# Patient Record
Sex: Male | Born: 1955
Health system: Midwestern US, Community
[De-identification: ages and names within clinical notes are randomized; demographics above are authoritative.]

## PROBLEM LIST (undated history)

## (undated) DIAGNOSIS — D049 Carcinoma in situ of skin, unspecified: Secondary | ICD-10-CM

---

## 2012-12-01 NOTE — Progress Notes (Signed)
[facility]  Kirstie Peri, M.D.  [phone]       Winter Trefz  [birth year]    57 y.o. male     Date of Visit: 12/01/2012    Chief Complaint:   Chief Complaint   Patient presents with   ??? Follow-up     est. pt comes in for a skin check.         I was asked to see this patient by Dr. William Hamburger.    History of Present Illness:  1.  Patient presents today for total body skin exam.  He has a long history of flat warts on his forearms.  He states that they are improving and much smaller and fewer in number than they were prior to beginning liquid nitrogen treatment.  He does not like to come in more often than every 6 months for his regular skin checks.    He has noticed some new rough scaly papules especially on his temples and sideburn areas.  They are scaly occasionally become crusted.  Not itching bleeding changing or growing        7/11 squamous cell carcinoma in situ left lower parasternal chest status post ED and C  10/13 dysplastic nevus, mild left dorsal foot    Review of Systems:  Constitutional: Reports general sense of well-being   Cardiac:  No active chest pain  Pulmonary:  No SOB  GI:  No abdominal pain     Past Medical History, Surgical History, Family History, Medications and Allergies reviewed.    Social History:   History     Social History   ??? Marital Status: Married     Spouse Name: N/A     Number of Children: N/A   ??? Years of Education: N/A     Occupational History   ??? Not on file.     Social History Main Topics   ??? Smoking status: Former Smoker   ??? Smokeless tobacco: Never Used   ??? Alcohol Use: Yes   ??? Drug Use: Not on file   ??? Sexual Activity: Not on file     Other Topics Concern   ??? Not on file     Social History Narrative       Physical Examination       The following were examined and determined to be normal: Psych/Neuro, Scalp/hair, Head/face, Conjunctivae/eyelids, Neck, Breast/axilla/chest, Abdomen, Back, RUE, LUE, RLE, LLE and Nails/digits.    The following were examined and  determined to be abnormal: Scalp/hair, Head/face, Breast/axilla/chest, Back, RUE and LUE.     -General: Well-appearing, NAD  1.  History of dysplastic nevus no evidence of recurrence.  History of squamous cell carcinoma no evidence of recurrence.  Multiple gritty erythematous papules in his sideburns and forehead/ scalp.  Multiple flat topped pink papules over his forearms and 2 on his right anterior shin, flat warts.  Multiple hyperkeratotic stuck on papules and plaques, seborrheic keratoses.    Assessment and Plan     1. Verruca -12 lesion(s) treated w/ liquid nitrogen x 2 cycles. Edu re: risk of blister formation, discomfort, scar, hypopigmentation. Discussed wound care.      2. AK (actinic keratosis) - -4 lesion(s) treated w/ liquid nitrogen x 2 cycles. Edu re: risk of blister formation, discomfort, scar, hypopigmentation. Discussed wound care.      3. Keratosis, seborrheic - Monitor for change   4. History of squamous cell carcinoma - NER, monitor     5. History of Dysplastic nevus-  NER

## 2013-05-31 NOTE — Progress Notes (Signed)
[facility]  [name], M.D.  [phone]       Raymond Griffin  08-06-1955    58 y.o. male     Date of Visit: 05/31/2013    Chief Complaint:   Chief Complaint   Patient presents with   ??? Follow-up     skin check         I was asked to see this patient by Dr. No ref. provider found.    History of Present Illness:  1.   Patient presents today for total body skin exam.     He has a long history of flat warts on his forearms. He states that they are improving and much smaller and fewer in number than they were prior to beginning liquid nitrogen treatment. He does not like to come in more often than every 6 months for his regular skin checks.  He has noticed multiple new lesions on his lower legs.    He has noticed multiple raised scaly brown papules and plaques.  They're not itching or bleeding.  He doesn't think that they're changing in shape or size.    7/11 squamous cell carcinoma in situ left lower parasternal chest status post ED and C  10/13 dysplastic nevus, mild left dorsal foot    Review of Systems:  Constitutional: Reports general sense of well-being   Cardiac:  No active chest pain  Pulmonary:  No SOB  GI:  No abdominal pain     Past Medical History, Surgical History, Family History, Medications and Allergies reviewed.    Social History:   History     Social History   ??? Marital Status: Married     Spouse Name: N/A     Number of Children: N/A   ??? Years of Education: N/A     Occupational History   ??? Not on file.     Social History Main Topics   ??? Smoking status: Former Smoker   ??? Smokeless tobacco: Never Used   ??? Alcohol Use: Yes   ??? Drug Use: Not on file   ??? Sexual Activity: Not on file     Other Topics Concern   ??? Not on file     Social History Narrative       Physical Examination       -General: Well-appearing, NAD  1. Normal affect.  Total body skin exam including scalp, face, neck, chest, abdomen, back, bilateral upper extremities, bilateral lower extremities, ocular conjunctiva, external  lips, and nails was performed.  Underwear area not examined.    Left ant shoulder 9 mm irregularly pigmented papule roatypia  Left mid PS back 1.3 cm irregularly pigmented plaque ro atypia  Left malar cheek- 8 mm gritty pink papule  Multiple flat topped pink papules- flat warts- arms and legs   Well healed scar at site of previous SCC   Well healed scar at site of dysplastic nevus, NER.              Assessment and Plan     1. Neoplasm of uncertain behavior of skin - Left ant shoulder 9 mm irregularly pigmented papule roatypia  Left mid PS back 1.3 cm irregularly pigmented plaque ro atypia  -Discussed possible diagnosis. Patient agreeable to biopsy. Verbal consent obtained after risks (infection, bleeding, scar), benefits and alternatives explained.   -Area(s) to be biopsied were marked with a surgical pen. Site(s) were cleansed with alcohol. Local anesthesia achieved with 1% lidocaine with epinephrine/sodium bicarbonate. Shave biopsy performed  with a razor blade. Hemostasis was achieved with aluminum chloride. The wound(s) were dressed with petrolatum and covered with a bandage. Specimen(s) sent to pathology. Pt educated re: risk of bleeding, infection, scar and wound care instructions.     2. AK (actinic keratosis) - -1 lesion(s) treated w/ liquid nitrogen x 2 cycles. Edu re: risk of blister formation, discomfort, scar, hypopigmentation. Discussed wound care.      3. Verruca - -11 lesion(s) treated w/ liquid nitrogen x 2 cycles. Edu re: risk of blister formation, discomfort, scar, hypopigmentation. Discussed wound care.      4. History of dysplastic nevus -no evidence of recurrence    5. History of squamous cell carcinoma - No evidence of recurrence. Discussed risk of subsequent skin cancers and increased risk of melanoma. Reviewed importance of monitoring skin for change and sun protection with hats and sunscreen, as well as sun avoidance.

## 2013-06-07 NOTE — Telephone Encounter (Signed)
Spoke with Patient in regards to Bx results of:    Location: 1. Left ant shoulder 2. Left mid PS back     Diagnosis: 1. Squamous acanthoma 2. Squamous acanthoma    Plan: Both Benign     Patient was advised to Return to clinic as scheduled.

## 2013-06-07 NOTE — Telephone Encounter (Signed)
Error

## 2013-06-07 NOTE — Telephone Encounter (Signed)
Spoke with Patient in regards to Bx results of:    Location: 1. Left mid PS back 2. Left anterior shoulder    Diagnosis: 1.    Plan:    Patient was advised to Return to clinic as scheduled.

## 2013-06-13 NOTE — Telephone Encounter (Signed)
Diagnosis: Squamous acanthoma for both sites  Benign

## 2013-12-06 ENCOUNTER — Encounter: Attending: Dermatology

## 2013-12-06 ENCOUNTER — Ambulatory Visit: Admit: 2013-12-06 | Discharge: 2013-12-06 | Payer: PRIVATE HEALTH INSURANCE | Attending: Dermatology

## 2013-12-06 DIAGNOSIS — L570 Actinic keratosis: Secondary | ICD-10-CM

## 2013-12-06 NOTE — Progress Notes (Signed)
Raymond Griffin  Raymond Griffin, M.D.  [phone]       Raymond Griffin  1955-06-29    58 y.o. male     Date of Visit: 12/06/2013    Chief Complaint:   Chief Complaint   Patient presents with   ??? Follow-up     6 month skin check         I was asked to see this patient by Dr. No ref. provider found.    History of Present Illness:  1.  Total body skin exam    Flat warts are improved.  Previously had quite a bit of difficulty with the spreading.  Has noticed good improvement since been treating them.    A few new scaly spots on his lateral face and ears.  Nothing else new or changing.  Wearing hats and sunscreen regularly.      Skin history:  7/11 squamous cell carcinoma in situ left lower parasternal chest status post ED and C  10/13 dysplastic nevus, mild left dorsal foot    Review of Systems:  Constitutional: Reports general sense of well-being   Cardiac:  No active chest pain  Pulmonary:  No SOB  GI:  No abdominal pain     Past Medical History, Surgical History, Family History, Medications and Allergies reviewed.    Social History:   History     Social History   ??? Marital Status: Married     Spouse Name: N/A     Number of Children: N/A   ??? Years of Education: N/A     Occupational History   ??? Not on file.     Social History Main Topics   ??? Smoking status: Former Smoker   ??? Smokeless tobacco: Never Used   ??? Alcohol Use: Yes   ??? Drug Use: Not on file   ??? Sexual Activity: Not on file     Other Topics Concern   ??? Not on file     Social History Narrative       Physical Examination       -General: Well-appearing, NAD  Normal affect.  Total body skin exam including scalp, face, neck, chest, abdomen, back, bilateral upper extremities, bilateral lower extremities, ocular conjunctiva, external lips, and nails was performed.  Underwear area not examined.  Multiple gritty erythematous papules over his forehead, lateral face, ears  Multiple flat topped pink papules on his forearms  Scattered on the trunk and extremities  are multiple well-defined round and oval symmetric smoothly-bordered uniformly brown macules and papules.  Well-healed scars at the site of his prior dysplastic nevi and squamous cell carcinomas   Assessment and Plan     1. AK (actinic keratosis) - -6 lesion(s) treated w/ liquid nitrogen x 2 cycles. Edu re: risk of blister formation, discomfort, scar, hypopigmentation. Discussed wound care.      2. Flat wart - -11 lesion(s) treated w/ liquid nitrogen x 2 cycles. Edu re: risk of blister formation, discomfort, scar, hypopigmentation. Discussed wound care.   Discussed treatment to prevent spread, given the viral nature of the condition.     3. Benign nevus - Benign acquired melanocytic nevi  -Recommend monthly self skin exams   -Educated regarding the ABCDEs of melanoma detection   -Call for any new/changing moles or concerning lesions  -Reviewed sun protective behavior -- sun avoidance during the peak hours of the day, sun-protective clothing (including hat and sunglasses), sunscreen use (water resistant, broad spectrum, SPF at least 30, need for reapplication  every 2 to 3 hours), avoidance of tanning beds      4. History of squamous cell carcinoma - No evidence of recurrence. Discussed risk of subsequent skin cancers and increased risk of melanoma. Reviewed importance of monitoring skin for change and sun protection with hats and sunscreen, as well as sun avoidance.     5. History of dysplastic nevus - no evidence of recurrence      RV 6 months

## 2014-06-06 ENCOUNTER — Ambulatory Visit: Admit: 2014-06-06 | Discharge: 2014-06-06 | Payer: PRIVATE HEALTH INSURANCE | Attending: Dermatology

## 2014-06-06 DIAGNOSIS — B078 Other viral warts: Secondary | ICD-10-CM

## 2014-06-06 NOTE — Progress Notes (Signed)
[facility]  [name], M.D.  [phone]       Raymond Griffin  September 25, 1955    59 y.o. male     Date of Visit: 06/06/2014    Chief Complaint:   Chief Complaint   Patient presents with   ??? Follow-up     6 month skin check        I was asked to see this patient by Dr. No ref. provider found.    History of Present Illness:  1.  Total body skin exam.    Patient has not noticed any new or changing lesions.  He is wearing hats and sunscreen.    Verruca on his left forearm continue to improve.  Still has a few small residual lesions.    Skin history:  7/11 squamous cell carcinoma in situ left lower parasternal chest status post ED and C  10/13 dysplastic nevus, mild left dorsal foot  Flat warts-forearms    Review of Systems:  Constitutional: Reports general sense of well-being   Cardiac:  No active chest pain  Pulmonary:  No SOB  GI:  No abdominal pain     Past Medical History, Surgical History, Family History, Medications and Allergies reviewed.    Social History:   History     Social History   ??? Marital Status: Married     Spouse Name: N/A     Number of Children: N/A   ??? Years of Education: N/A     Occupational History   ??? Not on file.     Social History Main Topics   ??? Smoking status: Former Smoker   ??? Smokeless tobacco: Never Used   ??? Alcohol Use: Yes   ??? Drug Use: Not on file   ??? Sexual Activity: Not on file     Other Topics Concern   ??? Not on file     Social History Narrative       Physical Examination       -General: Well-appearing, NAD  Normal affect.  Total body skin exam including scalp, face, neck, chest, abdomen, back, bilateral upper extremities, bilateral lower extremities, ocular conjunctiva, external lips, and nails was performed.  Underwear area not examined.  Flat top verrucous papule left anterior forearm, much smaller than previously  Gritty erythematous papules face and dorsal hands as well as dorsal forearms, actinic keratoses  Scattered on the trunk and extremities are multiple  well-defined round and oval symmetric smoothly-bordered uniformly brown macules and papules.  Multiple hyperkeratotic stuck on papules and plaques, seborrheic keratoses  Well-healed scar at the site of his prior dysplastic nevus and squamous cell carcinoma, no sign of recurrence      Assessment and Plan     1. Flat wart - -2 lesion(s) treated w/ liquid nitrogen x 2 cycles. Edu re: risk of blister formation, discomfort, scar, hypopigmentation. Discussed wound care.      2. AK (actinic keratosis) - -4 lesion(s) treated w/ liquid nitrogen x 2 cycles. Edu re: risk of blister formation, discomfort, scar, hypopigmentation. Discussed wound care.      3. Benign nevus - Benign acquired melanocytic nevi  -Recommend monthly self skin exams   -Educated regarding the ABCDEs of melanoma detection   -Call for any new/changing moles or concerning lesions  -Reviewed sun protective behavior -- sun avoidance during the peak hours of the day, sun-protective clothing (including hat and sunglasses), sunscreen use (water resistant, broad spectrum, SPF at least 30, need for reapplication every 2 to 3  hours), avoidance of tanning beds      4. Keratosis, seborrheic - monitor for change    5. History of squamous cell carcinoma - No evidence of recurrence. Discussed risk of subsequent skin cancers and increased risk of melanoma. Reviewed importance of monitoring skin for change and sun protection with hats and sunscreen, as well as sun avoidance.     6. History of dysplastic nevus -no evidence of recurrence

## 2014-12-12 MED ORDER — TRIAMCINOLONE ACETONIDE 0.1 % EX OINT
0.1 % | CUTANEOUS | 0 refills | Status: DC
Start: 2014-12-12 — End: 2016-09-23

## 2014-12-12 NOTE — Progress Notes (Signed)
[facility]  [name], M.D.  [phone]       Raymond Griffin  1955/08/04    59 y.o. male     Date of Visit: 12/12/2014    Chief Complaint:   Chief Complaint   Patient presents with   ??? Follow-up     TBSE        I was asked to see this patient by Dr. No ref. provider found.    History of Present Illness:  Patient presents today for total body skin exam.    He has developed a rash on his left lower leg.  He has been wearing a brace on that leg and notes that when he became hot and sweaty this summer he developed some erythema and scale.  Pruritic.  Tends to come and go and is worse after he has worn his brace.    No new or changing lesions.  Wearing hats and sunscreen regularly.    Skin history:  7/11 squamous cell carcinoma in situ left lower parasternal chest status post ED and C  10/13 dysplastic nevus, mild left dorsal foot  Flat warts-forearms  ??  Review of Systems:  Constitutional: Reports general sense of well-being   Cardiac:  No active chest pain  Pulmonary:  No SOB  GI:  No abdominal pain     Past Medical History, Surgical History, Family History, Medications and Allergies reviewed.    Social History:   Social History     Social History   ??? Marital status: Married     Spouse name: N/A   ??? Number of children: N/A   ??? Years of education: N/A     Occupational History   ??? Not on file.     Social History Main Topics   ??? Smoking status: Former Smoker   ??? Smokeless tobacco: Never Used   ??? Alcohol use Yes   ??? Drug use: Not on file   ??? Sexual activity: Not on file     Other Topics Concern   ??? Not on file     Social History Narrative       Physical Examination       -General: Well-appearing, NAD  Normal affect.  Total body skin exam including scalp, face, neck, chest, abdomen, back, bilateral upper extremities, bilateral lower extremities, ocular conjunctiva, external lips, and nails was performed.  Underwear area not examined.  Scattered on the trunk and extremities are multiple well-defined round  and oval symmetric smoothly-bordered uniformly brown macules and papules.  Multiple hyperkeratotic stuck on papules and plaques over his torso, seborrheic keratoses.  Well-healed scars at sites of prior skin cancers no sign of recurrence.  Multiple gritty erythematous papules over his scalp and temples, actinic keratoses.  Scattered erythematous scaly plaques on his left lower leg, irritant dermatitis      Assessment and Plan     1. AK (actinic keratosis) - scalp and temples-4 lesion(s) treated w/ liquid nitrogen. Edu re: risk of blister formation, discomfort, scar, hypopigmentation. Discussed wound care.      2. Benign nevus - Benign acquired melanocytic nevi  -Recommend monthly self skin exams   -Educated regarding the ABCDEs of melanoma detection   -Call for any new/changing moles or concerning lesions  -Reviewed sun protective behavior -- sun avoidance during the peak hours of the day, sun-protective clothing (including hat and sunglasses), sunscreen use (water resistant, broad spectrum, SPF at least 30, need for reapplication every 2 to 3 hours), avoidance of tanning beds  3. Keratosis, seborrheic -monitor for change    4. History of squamous cell carcinoma - No evidence of recurrence. Discussed risk of subsequent skin cancers and increased risk of melanoma. Reviewed importance of monitoring skin for change and sun protection with hats and sunscreen, as well as sun avoidance.     5. History of dysplastic nevus -no evidence of recurrence    6. Irritant dermatitis -triamcinolone 0.1% ointment b.i.d. for 2 weeks.  Reviewed proper use and side effects.

## 2015-06-12 ENCOUNTER — Encounter: Attending: Dermatology

## 2015-09-11 ENCOUNTER — Ambulatory Visit: Admit: 2015-09-11 | Discharge: 2015-09-11 | Payer: BLUE CROSS/BLUE SHIELD | Attending: Dermatology

## 2015-09-11 DIAGNOSIS — D485 Neoplasm of uncertain behavior of skin: Secondary | ICD-10-CM

## 2015-09-11 NOTE — Progress Notes (Signed)
[facility]  [name], M.D.  [phone]       Raymond Griffin  [birth year]    60 y.o. male     Date of Visit: 09/11/2015    Chief Complaint:   Chief Complaint   Patient presents with   ??? Skin Exam     total body        I was asked to see this patient by Dr. No ref. provider found.    History of Present Illness:  1. Total body skin exam    Patient has developed a new keratotic plaque right mid scalp.  Developed suddenly in May 2017 and is slowly increasing in size.  Asymptomatic-not itching, bleeding.    Multiple nevi.  Stable in size, shape, color.  Asymptomatic    Multiple seborrheic keratoses dorsal forearms and back.  Slowly increasing in size and number.  Asymptomatic.    Wearing hats and sunscreen.  Planning to retire in December    Skin history:  7/11 squamous cell carcinoma in situ left lower parasternal chest status post ED and C  10/13 dysplastic nevus, mild left dorsal foot  Actinic keratosis-liquid nitrogen  Flat warts-forearms    Review of Systems:  Constitutional: Reports general sense of well-being   Cardiac:  No active chest pain  Pulmonary:  No SOB  GI:  No abdominal pain     Past Medical History, Surgical History, Family History, Medications and Allergies reviewed.    Social History:   Social History     Social History   ??? Marital status: Married     Spouse name: N/A   ??? Number of children: N/A   ??? Years of education: N/A     Occupational History   ??? Not on file.     Social History Main Topics   ??? Smoking status: Former Smoker   ??? Smokeless tobacco: Never Used   ??? Alcohol use Yes   ??? Drug use: Not on file   ??? Sexual activity: Not on file     Other Topics Concern   ??? Not on file     Social History Narrative       Physical Examination       -General: Well-appearing, NAD  Normal affect.  Total body skin exam including scalp, face, neck, chest, abdomen, back, bilateral upper extremities, bilateral lower extremities, ocular conjunctiva, external lips, and nails was performed.   Underwear area not examined.  Scattered on the trunk and extremities are multiple well-defined round and oval symmetric smoothly-bordered uniformly brown macules and papules.well-healed scar at site of prior squamous cell carcinoma and dysplastic nevus  Multiple hyperkeratotic stuck on papules and plaques dorsal forearms and back-seborrheic keratoses.  Right mid scalp 1.1 cm keratotic plaque rule out squamous cell carcinoma          Assessment and Plan     1. Neoplasm of uncertain behavior of skin -right mid scalp--Discussed possible diagnosis. Patient agreeable to biopsy. Verbal consent obtained after risks (infection, bleeding, scar), benefits and alternatives explained.   -Area(s) to be biopsied were marked with a surgical pen. Site(s) were cleansed with alcohol. Local anesthesia achieved with 1% lidocaine with epinephrine/sodium bicarbonate. Shave biopsy performed with a razor blade. Hemostasis was achieved with aluminum chloride. The wound(s) were dressed with petrolatum and covered with a bandage. Specimen(s) sent to pathology. Pt educated re: risk of bleeding, infection, scar and wound care instructions.     2. Benign nevus - Benign acquired melanocytic nevi  -Recommend  monthly self skin exams   -Educated regarding the ABCDEs of melanoma detection   -Call for any new/changing moles or concerning lesions  -Reviewed sun protective behavior -- sun avoidance during the peak hours of the day, sun-protective clothing (including hat and sunglasses), sunscreen use (water resistant, broad spectrum, SPF at least 30, need for reapplication every 2 to 3 hours), avoidance of tanning beds      3. Seborrheic keratoses -Monitor for change   4. History of dysplastic nevus -no evidence of recurrence   5. History of nonmelanoma skin cancer - No evidence of recurrence. Discussed risk of subsequent skin cancers and increased risk of melanoma. Reviewed importance of monitoring skin for change and sun protection with hats and  sunscreen, as well as sun avoidance.

## 2015-09-18 ENCOUNTER — Telehealth

## 2015-09-18 NOTE — Telephone Encounter (Signed)
Error

## 2015-09-18 NOTE — Progress Notes (Signed)
Addendum right mid scalp Bowen's disease transected at the margin.  Refer to Mohs given size of lesion.

## 2015-09-18 NOTE — Addendum Note (Signed)
Addended by: Myna Bright on: 09/18/2015 04:06 PM     Modules accepted: Orders

## 2015-09-18 NOTE — Telephone Encounter (Signed)
Reviewed results of the biopsy with the patient.    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 Caryn Section     The patient expressed understanding of the plan.

## 2015-09-19 NOTE — Telephone Encounter (Signed)
Called and spoke to pt.    Explained that the path report showed Bowen's Disease (intraepidermal squamous cell carcinoma).  Pt understood.  Call complete

## 2015-09-19 NOTE — Telephone Encounter (Signed)
Patient called back.  He was in his car and didn't hear all of the results.  Wants to know what type of cancer he has.?    Contact # [phone]

## 2015-10-15 ENCOUNTER — Ambulatory Visit: Admit: 2015-10-15 | Discharge: 2015-10-15 | Payer: BLUE CROSS/BLUE SHIELD | Attending: Dermatology

## 2015-10-15 DIAGNOSIS — D044 Carcinoma in situ of skin of scalp and neck: Secondary | ICD-10-CM

## 2015-10-15 NOTE — Progress Notes (Signed)
MOHS PROCEDURE NOTE    PHYSICIAN:  Mirian Mo. Caryn Section, MD    ASSISTANT: Abigail Miyamoto, MA     REFERRING PROVIDER:  Veneda Melter, MD    PREOPERATIVE DIAGNOSIS: Squamous Cell Carcinoma in Situ     SPECIFIC MOHS INDICATIONS:  location    AUC SCORING:  8/9    POSTOPERATIVE DIAGNOSIS: SAME    LOCATION: Right mid scalp    OPERATIVE PROCEDURE:  MOHS MICROGRAPHIC SURGERY    RECONSTRUCTION OF DEFECT: Complex layered closure    PREOPERATIVE SIZE: 12x8 MM    DEFECT SIZE: 19x12 MM    LENGTH OF REPAIRED WOUND/SIZE OF FLAP/SIZE OF GRAFT:  42 MM    ANESTHESIA:  61mL 1% lidocaine with epinephrine 1:100,000 buffered.     EBL:  MINIMAL    DURATION OF PROCEDURE:  2 HOURS    POSTOPERATIVE OBSERVATION: 1 HOUR    SPECIMENS:  SEE MOHS MAP    COMPLICATIONS:  NONE    DESCRIPTION OF PROCEDURE:  The patient was given a mirror, as appropriate, and the biopsy site was identified, marked with a surgical marking pen, and verified by the patient.   Options for treatment were discussed and the patient was informed that Mohs surgery was the selected treatment based on its lower recurrence rate, given the features listed above, as compared to other treatment modalities such as excision, radiation, or curettage, and agreed with this treatment plan.  Risks and benefits including bruising, swelling, bleeding, infection, nerve injury, recurrence, and scarring were discussed with the patient prior to the procedure and a written consent detailing these and other risks was reviewed with the patient and signed.    There was a time out for person and procedure verification.  The surgical site was prepped with an antiseptic solution.  Application of an antiseptic solution was repeated before each surgical stage.      Stage I:  The clinically-apparent tumor was carefully defined and debulked, determining the edge of the surgical excision.    A thin layer of tumor-laden tissue was excised with a narrow margin of normal-appearing skin, using the technique of  Mohs.  A map was prepared to correspond to the area of skin from which it was excised.    Hemostasis was achieved using electrocautery.  The wound was bandaged.     The tissue was prepared for the cryostat and sectioned. 1 section(s) prepared. Each section was coded, cut, and stained for microscopic examination. The entire base and margins of the excised piece of tissue were examined by the surgeon.     No tumor was identified at the peripheral margins of stage I of microscopically controlled surgery.          DEFECT MANAGEMENT:    REPAIR DESCRIPTION:  Various closure modalities were discussed with the patient, and it was decided that a complex repair would best preserve normal anatomic and functional relationships. Additional risk of wound dehiscence was discussed.     The patient was placed on the procedure room table. The area was anesthetized with 1% lidocaine with epinephrine 1:100,000 buffered, was given a sterile prep using Chlorhexidine gluconate 4% solution, and draped in the usual sterile fashion. Recreation and enlargement of the wound was performed by excising cones of tissue via the triangulation technique.    The final incision lines were placed with respect for the patient's natural skin tension lines in a linear configuration to avoid functional and aesthetic distortion of adjacent free margins. Following extensive undermining, meticulous hemostasis was  obtained with spot monopolar electrodesiccation.  Subcutaneous dead space, dermis and the epidermis were approximated with 4-0 Prolene using simple interrupted stitches.    WOUND COVERAGE:  The wound was cleaned with normal saline solution, dried off, Aquaphor ointment was applied, and the wound was covered.  A dressing was applied for stabilization and light pressure.  The patient was given detailed oral and written instructions on postoperative care.      There were no complications.  The patient left the Unit in good medical condition.      FOLLOW-UP:  The patient will return for suture removal in 14-21 days.

## 2015-10-15 NOTE — Patient Instructions (Addendum)
[facility] Office Hours:    Monday-Thursday  7:30 AM-4:30 PM    Friday  9:00 AM-3:00 PM    Patient was given post operative instructions for scalp. All questions were reviewed.

## 2015-10-15 NOTE — Progress Notes (Signed)
PRE-PROCEDURE SCREENING    Pacemaker/ICD: No  Difficulty with numbing in the past: No  Local Anesthesia Reaction/passing out: No  Latex or adhesive allergy:  No  Bleeding/Clotting Disorders: No  Anticoagulant Therapy: Yes, ASA 81 mg  Joint prosthesis: No  Artificial Heart Valve: No  Stroke or Seizures: Yes  Organ Transplant or Lymphoma: No  Immunosuppression: No  Respiratory Problems: No

## 2015-10-16 NOTE — Telephone Encounter (Signed)
The patient was in the office on 10/15/15 for Mohs surgery located on the Right mid scalp with Complex layered closure repair.  The patient tolerated the procedure well and left the office in good condition.    A post-operative telephone call was placed at 14:47 in order to check on the patient's recovery process.  The patient reported doing well and had no complaints.  All of the patient's questions were answered.

## 2015-10-24 NOTE — Telephone Encounter (Signed)
Please call patient and address his question about his stitches.  He left a voice message at 11:28.

## 2015-10-24 NOTE — Telephone Encounter (Signed)
Patient is having a MRI today and was curious as what stiches were placed for his repair of the scalp. He wanted to have that information to tell the Tech that will do the exam. Informed he that he has Prolene stiches. He has no other concerns or questions at this moment. He reported doing well with his wound.

## 2015-11-02 NOTE — Progress Notes (Signed)
S:  The patient is here for suture removal s/p Mohs surgery on the Right mid scalp and Complex layered closure repair, 3 week(s) ago. The site appears well-healed without signs of infection (redness, pain or discharge). The sutures were removed.  Wound care and activity instructions given.  The patient was scheduled for follow-up as needed for scar/wound check.  The patient was scheduled for f/u with General Dermatology per their instructions.

## 2016-03-18 ENCOUNTER — Ambulatory Visit: Admit: 2016-03-18 | Discharge: 2016-03-18 | Payer: BLUE CROSS/BLUE SHIELD | Attending: Dermatology

## 2016-03-18 DIAGNOSIS — L570 Actinic keratosis: Secondary | ICD-10-CM

## 2016-03-18 NOTE — Progress Notes (Signed)
[facility]  [name], M.D.  [phone]       Raymond Griffin  04/19/1955    61 y.o. male     Date of Visit: 03/18/2016    Chief Complaint:   Chief Complaint   Patient presents with   ??? 6 Month Follow-Up     6 month TBSE, no specific concerns. Hx of NMSC,Dysplastic nevus, AK's        I was asked to see this patient by Dr. No ref. provider found.    History of Present Illness:  1. Total body skin exam    Multiple actinic keratoses including hypertrophic actinic keratoses over his chest, shoulders, mid scalp.  Dry.  Not itching or bleeding.    Multiple nevi over his torso.  Stable in size, shape, color.  Asymptomatic.Marland Kitchen  Patient has not noticed any new or changing pigmented lesions    Multiple seborrheic keratoses over his torso-slowly increasing in number.  Asymptomatic.    Does wear hats and sunscreen.  Healed well after Mohs  ??  Skin history:  7/11 squamous cell carcinoma in situ left lower parasternal chest status post ED and C  10/13 dysplastic nevus, mild left dorsal foot  9/17 SCCIS right mid scalp  Actinic keratosis-liquid nitrogen  Flat warts-forearms    Review of Systems:  Constitutional: Reports general sense of well-being       Past Medical History, Surgical History, Family History, Medications and Allergies reviewed.    Social History:   Social History     Social History   ??? Marital status: Married     Spouse name: N/A   ??? Number of children: N/A   ??? Years of education: N/A     Occupational History   ??? Not on file.     Social History Main Topics   ??? Smoking status: Former Smoker   ??? Smokeless tobacco: Never Used   ??? Alcohol use Yes   ??? Drug use: Unknown   ??? Sexual activity: Not on file     Other Topics Concern   ??? Not on file     Social History Narrative   ??? No narrative on file       Physical Examination       -General: Well-appearing, NAD  Normal affect.  Total body skin exam including scalp, face, neck, chest, abdomen, back, bilateral upper extremities, bilateral lower extremities,  ocular conjunctiva, external lips, and nails was performed.  Underwear area not examined.  Scattered on the trunk and extremities are multiple well-defined round and oval symmetric smoothly-bordered uniformly brown macules and papules.  Multiple gritty erythematous papules scalp, face, torso, arms-multiple hypertrophic lesions especially over his shoulders, chest.  Multiple hyperkeratotic stuck on papules and plaques over his torso.  Well-healed scars at sites of prior dysplastic nevus and squamous cell carcinomas no sign of recurrence      Assessment and Plan     1. AK (actinic keratosis) - 17-scalp, face, shoulders, chest, dorsal hands and forearms lesion(s) treated w/ liquid nitrogen. Edu re: risk of blister formation, discomfort, scar, hypopigmentation. Discussed wound care.      2. Benign nevus - Benign acquired melanocytic nevi  -Recommend monthly self skin exams   -Educated regarding the ABCDEs of melanoma detection   -Call for any new/changing moles or concerning lesions  -Reviewed sun protective behavior -- sun avoidance during the peak hours of the day, sun-protective clothing (including hat and sunglasses), sunscreen use (water resistant, broad spectrum, SPF at least  30, need for reapplication every 2 to 3 hours), avoidance of tanning beds      3. Seborrheic keratoses -Monitor for change    4. History of dysplastic nevus -No evidence of recurrence    5. History of nonmelanoma skin cancer - No evidence of recurrence. Discussed risk of subsequent skin cancers and increased risk of melanoma. Reviewed importance of monitoring skin for change and sun protection with hats and sunscreen, as well as sun avoidance.       -

## 2016-08-05 ENCOUNTER — Ambulatory Visit: Admit: 2016-08-05 | Discharge: 2016-08-05 | Payer: BLUE CROSS/BLUE SHIELD | Attending: Dermatology

## 2016-08-05 DIAGNOSIS — L570 Actinic keratosis: Secondary | ICD-10-CM

## 2016-08-05 MED ORDER — VALACYCLOVIR HCL 500 MG PO TABS
500 MG | ORAL_TABLET | ORAL | 0 refills | Status: DC
Start: 2016-08-05 — End: 2016-09-23

## 2016-08-05 NOTE — Progress Notes (Signed)
[facility]  [name], M.D.  [phone]       Raymond Griffin  [birth year]    61 y.o. male     Date of Visit: 08/05/2016    Chief Complaint:   Chief Complaint   Patient presents with   . Skin Lesion        I was asked to see this patient by Dr. No ref. provider found.    History of Present Illness:  1. Total body skin exam    New keratotic papule mid anterior scalp.  Dry.  Present for months.  Not painful.    Photodamage-progressive freckling and lentigines as well as sun exposed areas.  Does wear hats and sunscreen-retired and has a new job on a [place].    New hemorrhagic papules dorsal hands and forearms.  Develop after minimal injury.  Nontender      Skin history:  7/11 squamous cell carcinoma in situ left lower parasternal chest status post ED and C  10/13 dysplastic nevus, mild left dorsal foot  9/17 SCCIS right mid scalp status post Mohs  Actinic keratosis-liquid nitrogen  Flat warts-forearms    Review of Systems:  Constitutional: Reports general sense of well-being   No new or changing pigmented lesions, no new rashes    Past Medical History, Surgical History, Family History, Medications and Allergies reviewed.    Social History:   Social History     Social History   . Marital status: Married     Spouse name: N/A   . Number of children: N/A   . Years of education: N/A     Occupational History   . Not on file.     Social History Main Topics   . Smoking status: Former Research scientist (life sciences)   . Smokeless tobacco: Never Used   . Alcohol use Yes   . Drug use: No   . Sexual activity: Not on file     Other Topics Concern   . Not on file     Social History Narrative   . No narrative on file       Physical Examination       -General: Well-appearing, NAD  1. Normal affect.  Total body skin exam including scalp, face, neck, chest, abdomen, back, bilateral upper extremities, bilateral lower extremities, ocular conjunctiva, external lips, and nails was performed.  Examination normal unless stated below.   Underwear area not examined.  Scattered on the trunk and extremities are multiple well-defined round and oval symmetric smoothly-bordered uniformly brown macules and papules.  Multiple erythematous hemorrhagic papules dorsal hands-senile purpura.  Multiple gritty erythematous papules scalp, face, dorsal hands and forearms  Multiple hyperkeratotic stuck on papules over his torso    Mid anterior scalp 4 mm keratotic papule rule out squamous cell carcinoma  Right posterior shoulder 1.1 cm keratotic plaque rule out squamous cell carcinoma  Left chest 6 mm pink papule rule out basal cell carcinoma                    Assessment and Plan     1. AK (actinic keratosis) - 6 face, arms, scalp, hands lesion(s) treated w/ liquid nitrogen. Edu re: risk of blister formation, discomfort, scar, hypopigmentation. Discussed wound care.   P DT face and scalp, one hour, does need Valtrex prophylaxis-discussed side effects, contraindications.  Discussed importance of sun avoidance.  Patient would like to plan this for November, when he can be out of the sun    2.  Neoplasm of uncertain behavior of skin -Mid anterior scalp and right posterior shoulder and left chest--Discussed possible diagnosis. Patient agreeable to biopsy. Verbal consent obtained after risks (infection, bleeding, scar), benefits and alternatives explained.   -Area(s) to be biopsied were marked with a surgical pen. Site(s) were cleansed with alcohol. Local anesthesia achieved with 1% lidocaine with epinephrine/sodium bicarbonate. Shave biopsy performed with a razor blade. Hemostasis was achieved with aluminum chloride. The wound(s) were dressed with petrolatum and covered with a bandage. Specimen(s) sent to pathology. Pt educated re: risk of bleeding, infection, scar and wound care instructions.      Left chest and mid anterior scalp only:     Curettage performed after informed written consent obtained following explanation of risks, benefits, alternatives  -Local  anesthesia acheived with 1% lidocaine with epinephrine/sodium bicarbonate. Sharp curettage performed in 3 different directions. First pass size 5 mm mid anterior scalp and left chest 7 mm. Hemostasis obtained with aluminum chloride.   -Edu re: bleeding, discomfort, infection, scar  -Edu re: wound care, risk of recurrence     3. Benign nevus - Benign acquired melanocytic nevi  -Recommend monthly self skin exams   -Educated regarding the ABCDEs of melanoma detection   -Call for any new/changing moles or concerning lesions  -Reviewed sun protective behavior -- sun avoidance during the peak hours of the day, sun-protective clothing (including hat and sunglasses), sunscreen use (water resistant, broad spectrum, SPF at least 30, need for reapplication every 2 to 3 hours), avoidance of tanning beds      4. Seborrheic keratoses -Monitor for change    5. History of dysplastic nevus -Monitor for recurrence or new or changing pigmented lesions   6. History of nonmelanoma skin cancer - No evidence of recurrence. Discussed risk of subsequent skin cancers and increased risk of melanoma. Reviewed importance of monitoring skin for change and sun protection with hats and sunscreen, as well as sun avoidance.     7. Senile purpura ([place]) -Reassurance provided     Addendum: Mid anterior scalp hypertrophic actinic keratosis.  Left posterior shoulder hypertrophic actinic keratosis focally transected a deep margin.  Left chest actinic keratosis.  Left chest and mid anterior scalp treated with curettage at the time of biopsy.  Return visit for definitive treatment left posterior shoulder

## 2016-08-12 NOTE — Telephone Encounter (Signed)
Error

## 2016-08-12 NOTE — Telephone Encounter (Signed)
Called and LM for pt to call the office.     Date of biopsy: 08/05/16  Site of biopsy: Mid anterior scalp  Result: Actinic keratosis, hypertrophic    Plan: No further treatment      Date of biopsy: 08/05/16  Site of biopsy: Left posterior shoulder  Result: Hypertrophic actinic keratosis, focally transected at deep margin    Plan: LN2 needs scheduled offer 09/16/16 @ 4p      Date of biopsy: 08/05/16  Site of biopsy: Left chest  Result: Actinic keratosis    Plan: No further treatment

## 2016-08-12 NOTE — Telephone Encounter (Signed)
Reviewed results of the biopsy with the patient.    LN2 scheduled 09/23/16 @ 1p    The patient expressed understanding of the plan.

## 2016-08-20 NOTE — Telephone Encounter (Signed)
Belenda Cruise returned your call about scheduling PDT in November. You can reach him back at [phone].

## 2016-08-20 NOTE — Telephone Encounter (Signed)
-----   Message from Veneda Melter, MD sent at 08/05/2016 10:41 AM EDT -----  Please schedule PDT in November face, scalp, one hour.  Does need Valtrex-prescription sent to the pharmacy

## 2016-08-20 NOTE — Telephone Encounter (Signed)
LVM for patient to return call when ready to schedule PDT.

## 2016-08-20 NOTE — Telephone Encounter (Signed)
Pt c/b [phone]  Pt states:   - returning call to sch PDT  Please return call to discuss thanks    Pt was informed that call will be returned tomorrow Thursday, 7.26.18

## 2016-08-20 NOTE — Telephone Encounter (Signed)
Tried to reach patient-LVM to return call.

## 2016-08-21 NOTE — Telephone Encounter (Signed)
LVM for patient to return call.

## 2016-08-22 NOTE — Telephone Encounter (Signed)
Patient scheduled for both face and scalp PDT treatments.

## 2016-09-23 ENCOUNTER — Ambulatory Visit: Admit: 2016-09-23 | Discharge: 2016-09-23 | Payer: BLUE CROSS/BLUE SHIELD | Attending: Dermatology

## 2016-09-23 DIAGNOSIS — L570 Actinic keratosis: Secondary | ICD-10-CM

## 2016-09-23 NOTE — Progress Notes (Signed)
[facility]at[facility]  [name], M.D.  [phone]       Raymond Griffin  [birth year]    61 y.o. male     Date of Visit: 09/23/2016    Chief Complaint:   Chief Complaint   Patient presents with   ??? Lesion(s)     Cryo to left shoulder        I was asked to see this patient by Dr. No ref. provider found.    History of Present Illness:  1. Patient presents today for follow-up of biopsies done 08/05/16:  Miid anterior scalp: hypertrophic actinic keratosis, treated with curettage at time of biopsy.   Left chest: actinic keratosis, treated with curettage at time of biopsy.    Left posterior shoulder: hypertrophic actinic keratosis focally transected at the margin and not treated at time of biopsy.    Planning for PDT November for scalp and face-will need Valtrex prophylaxis  ??  Skin history:  7/11 squamous cell carcinoma in situ left lower parasternal chest status post ED and C  10/13 dysplastic nevus, mild left dorsal foot  9/17 SCCIS right mid scalp status post Mohs  Actinic keratosis-liquid nitrogen  Flat warts-forearms    Review of Systems:  Constitutional: Reports general sense of well-being       Past Medical History, Surgical History, Family History, Medications and Allergies reviewed.    Social History:   Social History     Social History   ??? Marital status: Married     Spouse name: N/A   ??? Number of children: N/A   ??? Years of education: N/A     Occupational History   ??? Not on file.     Social History Main Topics   ??? Smoking status: Former Smoker   ??? Smokeless tobacco: Never Used   ??? Alcohol use Yes   ??? Drug use: No   ??? Sexual activity: Not on file     Other Topics Concern   ??? Not on file     Social History Narrative   ??? No narrative on file       Physical Examination       -General: Well-appearing, NAD  1. Well-healed scars mid anterior scalp and left chest, no residual actinic keratosis.  Left posterior shoulder well-healed scar with residual gritty erythematous plaque      Assessment and Plan     1. AK  (actinic keratosis) - Left posterior shoulder-1 lesion(s) treated w/ liquid nitrogen. Edu re: risk of blister formation, discomfort, scar, hypopigmentation. Discussed wound care.        Patient planning for PDT face and scalp in November-follow-up of actinic keratoses and total body skin exam February

## 2016-12-24 ENCOUNTER — Encounter: Admit: 2016-12-24 | Discharge: 2016-12-24 | Payer: BLUE CROSS/BLUE SHIELD

## 2016-12-24 DIAGNOSIS — L570 Actinic keratosis: Secondary | ICD-10-CM

## 2016-12-24 NOTE — Progress Notes (Signed)
Photodynamic Therapy Procedure Note     Diagnosis: Actinic Keratoses    Location:       - Face      Procedure:     The sites of treatment were verified with the patient and the previous progress note.     The area to be treated was cleansed with alcohol (alcohol or acetone).    Levulan Kerastick was applied to the indicated area(s) above.  1 HR (time in hours) later the patient was exposed to blue light via the Blu-U for 16 minutes and 40 seconds.    1 Levulan Kerastick(s) were used. [facility] # [id number]    The patient was provided with appropriate eye protection.    Patient asked "if he could handle the remainder of the PDT-blulight treatment with 2 minutes and 7 seconds remaining" and he requested me to stop treatment after 14 minutes and 33 seconds.     The patient was educated regarding the potential side effects and risks including burning, stinging, erythema, edema, crusting, and dyspigmentation.  The patient was instructed to avoid sun or intense light for 48 hours after the treatment.

## 2016-12-31 ENCOUNTER — Encounter: Admit: 2016-12-31 | Discharge: 2016-12-31 | Payer: BLUE CROSS/BLUE SHIELD

## 2016-12-31 DIAGNOSIS — L570 Actinic keratosis: Secondary | ICD-10-CM

## 2016-12-31 NOTE — Progress Notes (Signed)
Photodynamic Therapy Procedure Note     Diagnosis: Actinic Keratoses    Location:    - Scalp      Procedure:     The sites of treatment were verified with the patient and the previous progress note.     The area to be treated was cleansed with alcohol (alcohol or acetone).    Levulan Kerastick was applied to the indicated area(s) above.  1 HR (time in hours) later the patient was exposed to blue light via the Blu-U for 16 minutes and 40 seconds.    1 Levulan Kerastick(s) were used. [facility] # 67308-101-06    The patient was provided with appropriate eye protection.      The patient tolerated the procedure well.      There were no immediate complications.     The patient was educated regarding the potential side effects and risks including burning, stinging, erythema, edema, crusting, and dyspigmentation.  The patient was instructed to avoid sun or intense light for 48 hours after the treatment.

## 2017-03-17 ENCOUNTER — Ambulatory Visit: Admit: 2017-03-17 | Discharge: 2017-03-17 | Payer: BLUE CROSS/BLUE SHIELD | Attending: Dermatology

## 2017-03-17 DIAGNOSIS — L570 Actinic keratosis: Secondary | ICD-10-CM

## 2017-03-17 NOTE — Progress Notes (Signed)
Raymond Griffin  Raymond Griffin, M.D.  [phone]       Raymond Griffin  October 13, 1955    62 y.o. male     Date of Visit: 03/17/2017    Chief Complaint:   Chief Complaint   Patient presents with   . Follow-up     AK (actinic keratosis)        I was asked to see this patient by Dr. No ref. provider found.    History of Present Illness:  1. Total body skin exam    Patient noticed good improvement of actinic keratoses after PDT-more improvement on his face and scalp.  Still has dry papules on his upper neck and scattered on his scalp.  Dry.  Not itching, bleeding, growing    Nevi-has not noticed any new or changing pigmented lesions.  Does wear hats and sunscreen regularly     Seborrheic keratoses over his torso.  Increasing in size and number but asymptomatic.        Skin history:  7/11 squamous cell carcinoma in situ left lower parasternal chest status post ED and C  10/13 dysplastic nevus, mild left dorsal foot  9/17 SCCIS right mid scalpstatus post Mohs    11/18 PDT face  12/18 PDT scalp    +HSV prophylaxis-Valtrex    Actinic keratosis-liquid nitrogen  Flat warts-forearms    Review of Systems:  Constitutional: Reports general sense of well-being   Skin: No new rashes     Past Medical History, Surgical History, Family History, Medications and Allergies reviewed.    Social History:   Social History     Social History   . Marital status: Married     Spouse name: N/A   . Number of children: N/A   . Years of education: N/A     Occupational History   . Not on file.     Social History Main Topics   . Smoking status: Former Research scientist (life sciences)   . Smokeless tobacco: Never Used   . Alcohol use Yes   . Drug use: No   . Sexual activity: Not on file     Other Topics Concern   . Not on file     Social History Narrative   . No narrative on file       Physical Examination       -General: Well-appearing, NAD  1. Normal affect.  Total body skin exam including scalp, face, neck, chest, abdomen, back, bilateral upper extremities,  bilateral lower extremities excluding socks, ocular conjunctiva, external lips, and nails was performed.  Examination normal unless stated below.  Underwear area not examined.  Scattered on the trunk and extremities are multiple well-defined round and oval symmetric smoothly-bordered uniformly brown macules and papules.  Gritty erythematous papules scattered over his scalp, right forehead, right cheek, increased number of actinic keratoses over his neck and under his jaw line bilaterally.  Also scattered over his chest, shoulders, upper back.  Multiple hyperkeratotic stuck on papules and plaques over his torso.  Multiple well-healed scars no sign of recurrence      Assessment and Plan     1. AK (actinic keratosis) -Good evidence of improvement of actinic keratoses-recommended PDT again in the fall for his scalp 19-scalp, neck, face, chest, shoulders, back lesion(s) treated w/ liquid nitrogen. Edu re: risk of blister formation, discomfort, scar, hypopigmentation. Discussed wound care.      2. Benign nevus - Benign acquired melanocytic nevi  -Recommend monthly self skin exams   -Educated  regarding the ABCDEs of melanoma detection   -Call for any new/changing moles or concerning lesions  -Reviewed sun protective behavior -- sun avoidance during the peak hours of the day, sun-protective clothing (including hat and sunglasses), sunscreen use (water resistant, broad spectrum, SPF at least 30, need for reapplication every 2 to 3 hours), avoidance of tanning beds      3. Seborrheic keratoses -Monitor for change    4. History of dysplastic nevus -Monitor for recurrence   5. History of nonmelanoma skin cancer - No evidence of recurrence. Discussed risk of subsequent skin cancers and increased risk of melanoma. Reviewed importance of monitoring skin for change and sun protection with hats and sunscreen, as well as sun avoidance.

## 2017-09-15 ENCOUNTER — Encounter: Attending: Dermatology

## 2017-09-22 ENCOUNTER — Ambulatory Visit: Admit: 2017-09-22 | Discharge: 2017-09-22 | Payer: BLUE CROSS/BLUE SHIELD | Attending: Dermatology

## 2017-09-22 DIAGNOSIS — L570 Actinic keratosis: Secondary | ICD-10-CM

## 2017-09-22 MED ORDER — VALACYCLOVIR HCL 1 G PO TABS
1 g | ORAL_TABLET | ORAL | 2 refills | Status: AC
Start: 2017-09-22 — End: ?

## 2017-09-22 MED ORDER — FLUOROURACIL 5 % EX CREA
5 % | CUTANEOUS | 0 refills | Status: DC
Start: 2017-09-22 — End: 2018-03-23

## 2017-09-22 NOTE — Progress Notes (Signed)
[facility]  [name], M.D.  [phone]       Raymond Griffin  1955-09-21    62 y.o. male     Date of Visit: 09/22/2017    Chief Complaint:   Chief Complaint   Patient presents with   ??? 6 Month Follow-Up   ??? Skin Lesion        I was asked to see this patient by Dr. No ref. provider found.    History of Present Illness:  1.  Total body skin exam    Increasing number of actinic keratoses on his temples.  Scalp and forehead still improved after PDT.    Patient does have a history of labial herpes simplex-takes Valtrex prior to PDT    New hemorrhagic papule right distal great toe-improves and then recurs.  Has not identified any shoes that rub in that area.  Not diabetic.  Has never been infected.    Multiple nevi-stable in size, shape, color.  Has not noticed any new or changing pigmented lesions    Skin history:  7/11 squamous cell carcinoma in situ left lower parasternal chest status post [facility]  10/13 dysplastic nevus, mild left dorsal foot   9/17 SCCIS right mid scalp??status post Mohs  ??  11/18 PDT face  12/18 PDT scalp  ??  +HSV prophylaxis-Valtrex  ??  Actinic keratosis-liquid nitrogen  Flat warts-forearms    Review of Systems:  Constitutional: Reports general sense of well-being   Skin-no new or changing pigmented lesions, no new rashes    Past Medical History, Surgical History, Family History, Medications and Allergies reviewed.    Social History:   Social History     Socioeconomic History   ??? Marital status: Married     Spouse name: Not on file   ??? Number of children: Not on file   ??? Years of education: Not on file   ??? Highest education level: Not on file   Occupational History   ??? Not on file   Social Needs   ??? Financial resource strain: Not on file   ??? Food insecurity:     Worry: Not on file     Inability: Not on file   ??? Transportation needs:     Medical: Not on file     Non-medical: Not on file   Tobacco Use   ??? Smoking status: Former Smoker   ??? Smokeless tobacco: Never Used   Substance and  Sexual Activity   ??? Alcohol use: Yes   ??? Drug use: No   ??? Sexual activity: Not on file   Lifestyle   ??? Physical activity:     Days per week: Not on file     Minutes per session: Not on file   ??? Stress: Not on file   Relationships   ??? Social connections:     Talks on phone: Not on file     Gets together: Not on file     Attends religious service: Not on file     Active member of club or organization: Not on file     Attends meetings of clubs or organizations: Not on file     Relationship status: Not on file   ??? Intimate partner violence:     Fear of current or ex partner: Not on file     Emotionally abused: Not on file     Physically abused: Not on file     Forced sexual activity: Not on file  Other Topics Concern   ??? Not on file   Social History Narrative   ??? Not on file       Physical Examination       -General: Well-appearing, NAD  1. Normal affect.  Total body skin exam including scalp, face, neck, chest, abdomen, back, bilateral upper extremities, bilateral lower extremities, ocular conjunctiva, external lips, and nails was performed.  Examination normal unless stated below.  Underwear area not examined.  Scattered on the trunk and extremities are multiple well-defined round and oval symmetric smoothly-bordered uniformly brown macules and papules.  Multiple gritty erythematous papules scalp, temples, top of ears, preauricular cheeks.  No active labial herpes simplex today.  Right distal great toe with superficial hemorrhage in stratum corneum-no surrounding erythema or ulceration  Well-healed scar at site of prior squamous cell carcinoma no sign of recurrent    Assessment and Plan     1. AK (actinic keratosis) -7-face lesion(s) treated w/ liquid nitrogen. Edu re: risk of blister formation, discomfort, scar, hypopigmentation. Discussed wound care.   Efudex 5% cream twice daily for 2 weeks scalp, forehead, temples, top of ears, preauricular cheeks.  Patient will treat in February after he returns from [place].   Reviewed risks, complications, proper application.    2. Benign nevus - Benign acquired melanocytic nevi  -Recommend monthly self skin exams   -Educated regarding the ABCDEs of melanoma detection   -Call for any new/changing moles or concerning lesions  -Reviewed sun protective behavior -- sun avoidance during the peak hours of the day, sun-protective clothing (including hat and sunglasses), sunscreen use (water resistant, broad spectrum, SPF at least 30, need for reapplication every 2 to 3 hours), avoidance of tanning beds      3. HSV infection-Valtrex 2 g p.o. once, repeat in 12 hours.  No history of renal insufficiency   4. Papule --superficial hemorrhage and stratum corneum-recommended avoiding trauma and protection while wearing shoes.  Discussed expected healing.  He will see podiatry as needed   5. History of nonmelanoma skin cancer - No evidence of recurrence. Discussed risk of subsequent skin cancers and increased risk of melanoma. Reviewed importance of monitoring skin for change and sun protection with hats and sunscreen, as well as sun avoidance.

## 2018-03-23 ENCOUNTER — Ambulatory Visit: Admit: 2018-03-23 | Discharge: 2018-03-23 | Payer: BLUE CROSS/BLUE SHIELD | Attending: Dermatology

## 2018-03-23 DIAGNOSIS — L820 Inflamed seborrheic keratosis: Secondary | ICD-10-CM

## 2018-03-23 NOTE — Progress Notes (Signed)
[facility]  [name], M.D.  [phone]       Raymond Griffin  05-08-1955    63 y.o. male     Date of Visit: 03/23/2018    Chief Complaint:   Chief Complaint   Patient presents with   ??? 6 Month Follow-Up   ??? Skin Lesion        I was asked to see this patient by Dr. No ref. provider found.    History of Present Illness:  1.  Total body skin exam    Irritated seborrheic keratosis left superior shoulder-has increased in size, now rubbing on his clothing and becoming traumatized.  He would like this removed.    Actinic keratoses-completed Efudex 5% cream twice daily for 2 weeks forehead, temples, cheeks, top of ears, vertex of scalp, upper neck 1 week ago.  Healing without difficulty.  Did develop quite a bit of erythema and crust.  No difficulty with infection    Multiple nevi.  Stable in size, shape, color.  Has not noticed any new or changing pigmented lesions.    Photodamage-progressive freckling and lentigines of sun exposed areas.  Is wearing hats and sunscreen regularly.    Skin history:  7/11 squamous cell carcinoma in situ left lower parasternal chest status post [facility]  10/13 dysplastic nevus, mild left dorsal foot   9/17 SCCIS right mid scalp??status post Mohs  ??  11/18 PDT face  12/18 PDT scalp  2/20 Efudex forehead, temples, ears, neck, scalp  ??  +HSV prophylaxis-Valtrex  ??  Actinic keratosis-liquid nitrogen  Flat warts-forearms    Review of Systems:  Constitutional: Reports general sense of well-being   Skin-no new or changing pigmented lesions, no new rashes    Past Medical History, Surgical History, Family History, Medications and Allergies reviewed.    Social History:   Social History     Socioeconomic History   ??? Marital status: Married     Spouse name: Not on file   ??? Number of children: Not on file   ??? Years of education: Not on file   ??? Highest education level: Not on file   Occupational History   ??? Not on file   Social Needs   ??? Financial resource strain: Not on file   ??? Food  insecurity:     Worry: Not on file     Inability: Not on file   ??? Transportation needs:     Medical: Not on file     Non-medical: Not on file   Tobacco Use   ??? Smoking status: Former Smoker   ??? Smokeless tobacco: Never Used   Substance and Sexual Activity   ??? Alcohol use: Yes   ??? Drug use: No   ??? Sexual activity: Not on file   Lifestyle   ??? Physical activity:     Days per week: Not on file     Minutes per session: Not on file   ??? Stress: Not on file   Relationships   ??? Social connections:     Talks on phone: Not on file     Gets together: Not on file     Attends religious service: Not on file     Active member of club or organization: Not on file     Attends meetings of clubs or organizations: Not on file     Relationship status: Not on file   ??? Intimate partner violence:     Fear of current or ex partner:  Not on file     Emotionally abused: Not on file     Physically abused: Not on file     Forced sexual activity: Not on file   Other Topics Concern   ??? Not on file   Social History Narrative   ??? Not on file       Physical Examination       -General: Well-appearing, NAD  1. Normal affect.  Total body skin exam including scalp, face, neck, chest, abdomen, back, bilateral upper extremities, bilateral lower extremities, ocular conjunctiva, external lips, and nails was performed.  Examination normal unless stated below.  Underwear area not examined.  Scattered on the trunk and extremities are multiple well-defined round and oval symmetric smoothly-bordered uniformly brown macules and papules.  Background erythema forehead, temples, cheeks, vertex of scalp-a few residual crusted papules that are still healing.  Hyperkeratotic stuck on 6 mm brown papule left superior shoulder.  Multiple other similar lesions torso.  Multiple well-healed scars          Assessment and Plan     1. Seborrheic keratoses, inflamed-left superior shoulder--Verbal consent obtained after risks (infection, bleeding, scar), benefits and alternatives  explained.   -Area(s) to be shaved were marked with a surgical pen. Site(s) were cleansed with alcohol. Local anesthesia achieved with 1% lidocaine with epinephrine/sodium bicarbonate. Shave lesion performed with a razor blade. Hemostasis was achieved with aluminum chloride. The wound(s) were dressed with petrolatum and covered with a bandage. Specimen(s) sent to pathology. Pt educated re: risk of bleeding, infection, scar and wound care instructions.      2. Seborrheic keratoses-monitor for change   3. Benign nevus - Benign acquired melanocytic nevi  -Recommend monthly self skin exams   -Educated regarding the ABCDEs of melanoma detection   -Call for any new/changing moles or concerning lesions  -Reviewed sun protective behavior -- sun avoidance during the peak hours of the day, sun-protective clothing (including hat and sunglasses), sunscreen use (water resistant, broad spectrum, SPF at least 30, need for reapplication every 2 to 3 hours), avoidance of tanning beds ]   4. Keratosis, actinic -reevaluate at next visit after he has healed from Efudex-appropriate response to Efudex   5. History of nonmelanoma skin cancer - No evidence of recurrence. Discussed risk of subsequent skin cancers and increased risk of melanoma. Reviewed importance of monitoring skin for change and sun protection with hats and sunscreen, as well as sun avoidance.     6. History of dysplastic nevus-monitor for change

## 2018-03-25 LAB — DERMATOLOGY PATHOLOGY

## 2018-09-21 ENCOUNTER — Ambulatory Visit: Admit: 2018-09-21 | Discharge: 2018-09-21 | Payer: MEDICARE | Attending: Dermatology

## 2018-09-21 DIAGNOSIS — L570 Actinic keratosis: Secondary | ICD-10-CM

## 2018-09-21 NOTE — Progress Notes (Signed)
[name]  [name], M.D.  [phone]       Raymond Griffin  November 04, 1955    63 y.o. male     Date of Visit: 09/21/2018    Chief Complaint:   Chief Complaint   Patient presents with   ??? Skin Lesion     FBSE;         I was asked to see this patient by Dr. No ref. provider found.    History of Present Illness:  1.  Total-body skin exam    Multiple nevi-stable in size, shape, color.  Has not noticed any new or changing pigmented lesions    Actinic keratoses-much improved since Efudex 2/20.  Does have multiple lesions dorsal forearms and has noted 1 left lower eyelid just beneath his eyelashes.  Dry.  No discrete lesion increasing in size    Skin history:  7/11 squamous cell carcinoma in situ left lower parasternal chest status post [facility]  10/13 dysplastic nevus, mild left dorsal foot??  9/17 SCCIS right mid scalp??status post Mohs  ??  11/18 PDT face  12/18 PDT scalp  2/20 Efudex forehead, temples, ears, neck, scalp  ??  +HSV prophylaxis-Valtrex  ??  Actinic keratosis-liquid nitrogen  Flat warts-forearms    Review of Systems:  Constitutional: Reports general sense of well-being       Past Medical History, Surgical History, Family History, Medications and Allergies reviewed.    Social History:   Social History     Socioeconomic History   ??? Marital status: Married     Spouse name: Not on file   ??? Number of children: Not on file   ??? Years of education: Not on file   ??? Highest education level: Not on file   Occupational History   ??? Not on file   Social Needs   ??? Financial resource strain: Not on file   ??? Food insecurity     Worry: Not on file     Inability: Not on file   ??? Transportation needs     Medical: Not on file     Non-medical: Not on file   Tobacco Use   ??? Smoking status: Former Smoker   ??? Smokeless tobacco: Never Used   Substance and Sexual Activity   ??? Alcohol use: Yes   ??? Drug use: No   ??? Sexual activity: Not on file   Lifestyle   ??? Physical activity     Days per week: Not on file     Minutes per  session: Not on file   ??? Stress: Not on file   Relationships   ??? Social Product manager on phone: Not on file     Gets together: Not on file     Attends religious service: Not on file     Active member of club or organization: Not on file     Attends meetings of clubs or organizations: Not on file     Relationship status: Not on file   ??? Intimate partner violence     Fear of current or ex partner: Not on file     Emotionally abused: Not on file     Physically abused: Not on file     Forced sexual activity: Not on file   Other Topics Concern   ??? Not on file   Social History Narrative   ??? Not on file       Physical Examination       -  General: Well-appearing, NAD  1. Normal affect.  Total body skin exam including scalp, face, neck, chest, abdomen, back, bilateral upper extremities, bilateral lower extremities, ocular conjunctiva, external lips, and nails was performed.  Examination normal unless stated below.  Underwear area not examined.  Scattered on the trunk and extremities are multiple well-defined round and oval symmetric smoothly-bordered uniformly brown macules and papules.  Gritty erythematous papule left lower eyelid, scalp, and dorsal forearms.  Well-healed scars no sign of recurrence      Assessment and Plan     1. AK (actinic keratosis) -12-dorsal forearms, scalp, and left lower eyelid lesion(s) treated w/ liquid nitrogen. Edu re: risk of blister formation, discomfort, scar, hypopigmentation. Discussed wound care.      2. Benign nevus - Benign acquired melanocytic nevi  -Recommend monthly self skin exams   -Educated regarding the ABCDEs of melanoma detection   -Call for any new/changing moles or concerning lesions  -Reviewed sun protective behavior -- sun avoidance during the peak hours of the day, sun-protective clothing (including hat and sunglasses), sunscreen use (water resistant, broad spectrum, SPF at least 30, need for reapplication every 2 to 3 hours), avoidance of tanning beds      3.  History of nonmelanoma skin cancer - No evidence of recurrence. Discussed risk of subsequent skin cancers and increased risk of melanoma. Reviewed importance of monitoring skin for change and sun protection with hats and sunscreen, as well as sun avoidance.

## 2019-03-22 ENCOUNTER — Ambulatory Visit: Admit: 2019-03-22 | Discharge: 2019-03-22 | Payer: MEDICARE | Attending: Dermatology

## 2019-03-22 DIAGNOSIS — L570 Actinic keratosis: Secondary | ICD-10-CM

## 2019-03-22 MED ORDER — TRIAMCINOLONE ACETONIDE 0.1 % EX CREA
0.1 % | CUTANEOUS | 2 refills | Status: AC
Start: 2019-03-22 — End: ?

## 2019-03-22 NOTE — Progress Notes (Signed)
[facility]  [name], M.D.  [phone]       Raymond Griffin  [birth year]    64 y.o. male     Date of Visit: 03/22/2019    Chief Complaint:   Chief Complaint   Patient presents with   ??? Skin Lesion     TBSE        I was asked to see this patient by Dr. No ref. provider found.    History of Present Illness:  1.  Total-body skin exam    Psoriasis-scalp, arms, lower legs-multiple scaly erythematous papules and plaques.  Mildly bothersome.  Not using an antidandruff shampoo.  Spent January in [place] and psoriasis developed when he returned to [place].  Planning to move to [place] from November through May each year.    Multiple nevi.  Stable in size, shape, color.  Has not noticed any new or changing pigmented lesions.    Dry papules across his upper shoulders-dry but not itching, bleeding.  Asymptomatic.  Nontender.  Not growing.    Skin history:  7/11 squamous cell carcinoma in situ left lower parasternal chest status post [facility]  10/13 dysplastic nevus, mild left dorsal foot??  9/17 SCCIS right mid scalp??status post Mohs  ??  11/18 PDT face  12/18 PDT scalp  2/20 Efudex forehead, temples, ears, neck, scalp  ??  +HSV prophylaxis-Valtrex  ??  Actinic keratosis-liquid nitrogen  Flat warts-forearms    Review of Systems:  Constitutional: Reports general sense of well-being       Past Medical History, Surgical History, Family History, Medications and Allergies reviewed.    Social History:   Social History     Socioeconomic History   ??? Marital status: Married     Spouse name: Not on file   ??? Number of children: Not on file   ??? Years of education: Not on file   ??? Highest education level: Not on file   Occupational History   ??? Not on file   Social Needs   ??? Financial resource strain: Not on file   ??? Food insecurity     Worry: Not on file     Inability: Not on file   ??? Transportation needs     Medical: Not on file     Non-medical: Not on file   Tobacco Use   ??? Smoking status: Former Smoker   ??? Smokeless  tobacco: Never Used   Substance and Sexual Activity   ??? Alcohol use: Yes   ??? Drug use: No   ??? Sexual activity: Not on file   Lifestyle   ??? Physical activity     Days per week: Not on file     Minutes per session: Not on file   ??? Stress: Not on file   Relationships   ??? Social Product manager on phone: Not on file     Gets together: Not on file     Attends religious service: Not on file     Active member of club or organization: Not on file     Attends meetings of clubs or organizations: Not on file     Relationship status: Not on file   ??? Intimate partner violence     Fear of current or ex partner: Not on file     Emotionally abused: Not on file     Physically abused: Not on file     Forced sexual activity: Not on file   Other Topics  Concern   ??? Not on file   Social History Narrative   ??? Not on file       Physical Examination       -General: Well-appearing, NAD  1. Normal affect.  Total body skin exam including scalp, face, neck, chest, abdomen, back, bilateral upper extremities, bilateral lower extremities between knees and socks anteriorly, ocular conjunctiva, external lips, and nails was performed.  Examination normal unless stated below.  Underwear area not examined.  Scattered on the trunk and extremities are multiple well-defined round and oval symmetric smoothly-bordered uniformly brown macules and papules.  Gritty erythematous papules and plaques across his upper shoulders  Erythematous scaly well-demarcated plaques scalp, posterior arms, lower legs  Well-healed scars no sign of recurrence    Assessment and Plan     1. AK (actinic keratosis) -5-shoulders lesion(s) treated w/ liquid nitrogen. Edu re: risk of blister formation, discomfort, scar, hypopigmentation. Discussed wound care.      2. Benign nevus - Benign acquired melanocytic nevi  -Recommend monthly self skin exams   -Educated regarding the ABCDEs of melanoma detection   -Call for any new/changing moles or concerning lesions  -Reviewed sun  protective behavior -- sun avoidance during the peak hours of the day, sun-protective clothing (including hat and sunglasses), sunscreen use (water resistant, broad spectrum, SPF at least 30, need for reapplication every 2 to 3 hours), avoidance of tanning beds      3. Psoriasis-triamcinolone 0.1% cream twice daily up to 2 weeks as needed for flares.  Gentle soaps, moisturize regularly.  Head and shoulders shampoo for his scalp as needed.  Discussed that this may improve when he moves to [place] for the winter.   4. History of nonmelanoma skin cancer - No evidence of recurrence. Discussed risk of subsequent skin cancers and increased risk of melanoma. Reviewed importance of monitoring skin for change and sun protection with hats and sunscreen, as well as sun avoidance.

## 2019-10-12 ENCOUNTER — Encounter: Attending: Dermatology

## 2022-12-24 IMAGING — MR MRI BRAIN W/WO CONTRAST
12 of 16 series · 31 of 48 positions shown · IV contrast (gadavist)
Comparison: None

________________________________________________________________________________________________ 
MRI BRAIN W/WO CONTRAST, 12/24/2022 [DATE]: 
CLINICAL INDICATION: Epilepsy, unsp, not intractable, without status epilepticus 
. History of prior seizures in 5612.
TECHNIQUE: Multiplanar, multiecho position MR images of the brain were performed 
without and with 13.5 mL of Gadavist were injected intravenously by hand. 1.5 mL 
of Gadavist discarded. Patient was scanned on a 3T magnet.

[Series 101: survey · axial · 10.0mm · 0.98mm/px · 1 of 5 slices shown]
[im 1/5]
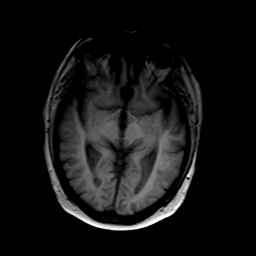

[Series 203: dadc map · axial · 4.0mm · 1.07mm/px · 1 of 30 slices shown (1 of 2)]
[im 1/30]
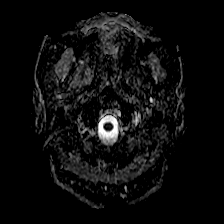

[Series 204: isob (id) · axial · 4.0mm · 1.07mm/px · 1 of 30 slices shown (1 of 2)]
[im 1/30]
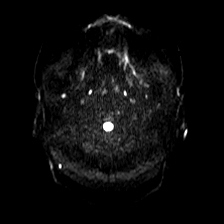

[Series 303: dadc map · coronal · 4.0mm · 0.81mm/px · 1 of 35 slices shown (2 of 2)]
[im 1/35]
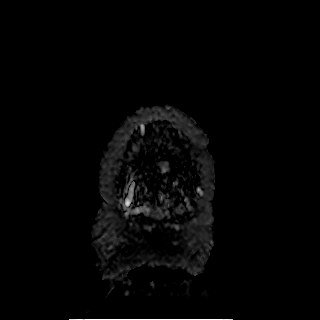

[Series 304: isob (id) · coronal · 4.0mm · 0.81mm/px · 2 of 36 slices shown (2 of 2)]
[im 1/36]
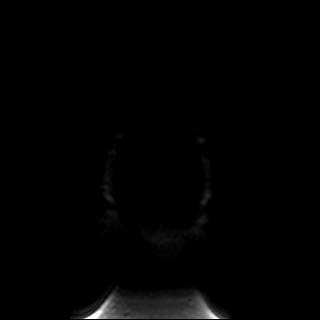
[im 36/36]
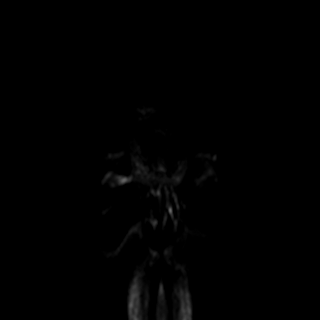

[Series 401: t1_se_sag · sagittal · 4.0mm · 0.37mm/px · 1 of 27 slices shown]
[im 1/27]
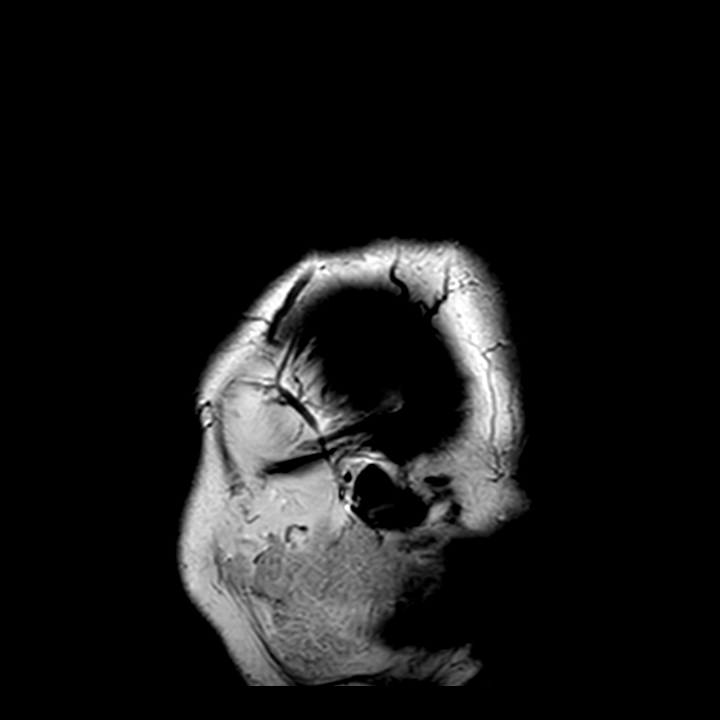

[Series 501: FLAIR fat-sat · axial · 5.0mm · 0.60mm/px · z∈[-79,+83]mm · 2 of 28 slices shown]
[im 1/28]
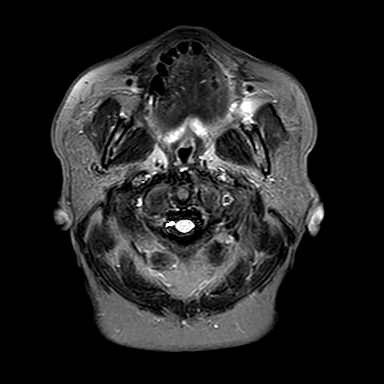
[im 28/28]
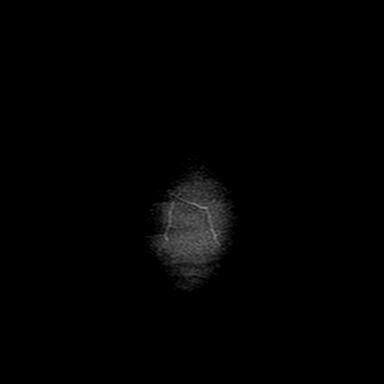

[Series 601: SWI · axial · 3.0mm · 0.53mm/px · z∈[-72,+76]mm · 6 of 100 slices shown (1 of 2)]
[im 1/100]
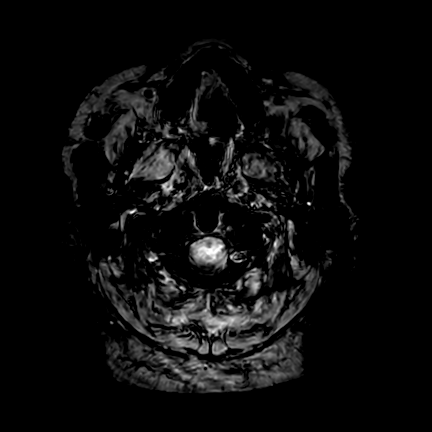
[im 20/100]
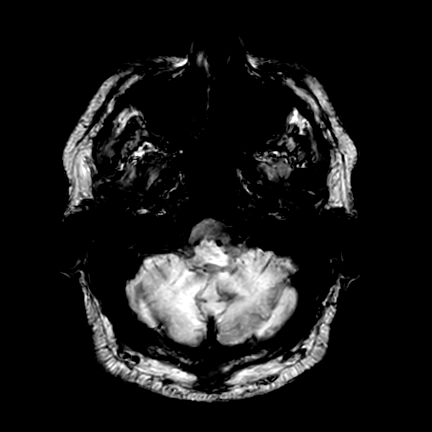
[im 40/100]
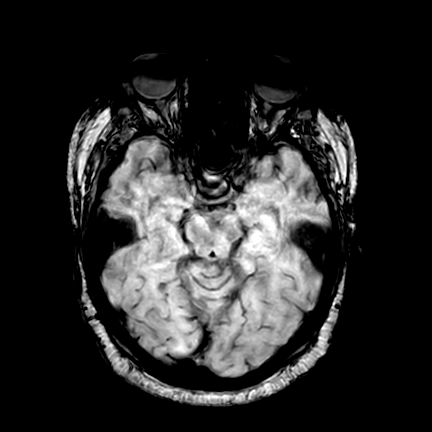
[im 60/100]
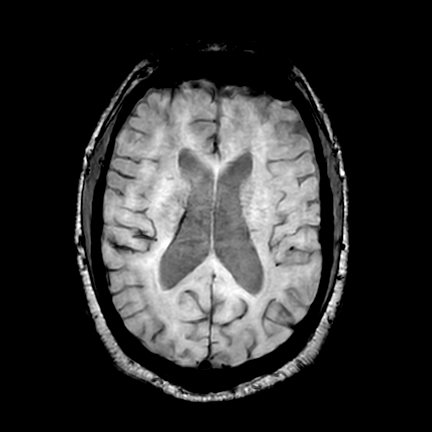
[im 80/100]
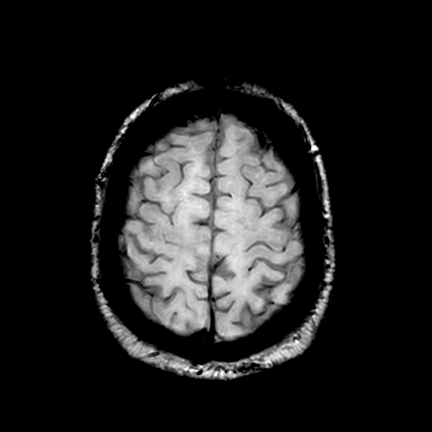
[im 100/100]
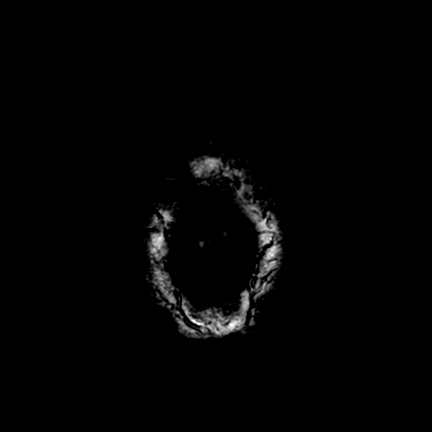

[Series 602: SWI · axial · 10.0mm · 0.53mm/px · z∈[-73,+81]mm · 4 of 78 slices shown (2 of 2)]
[im 1/78]
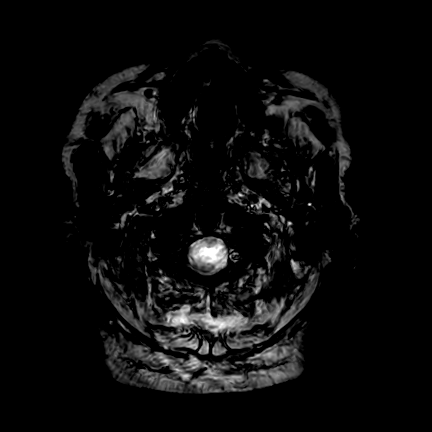
[im 26/78]
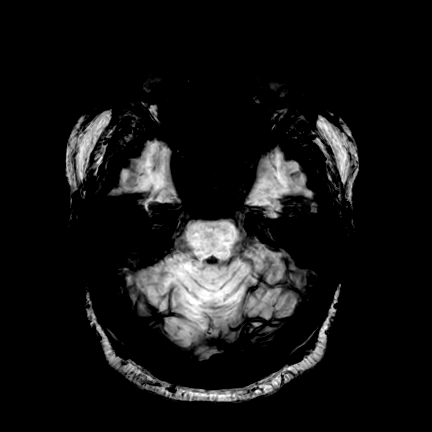
[im 52/78]
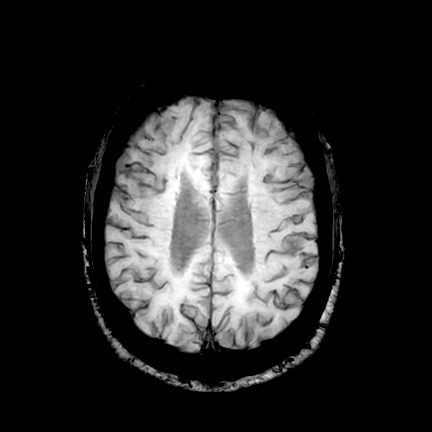
[im 78/78]
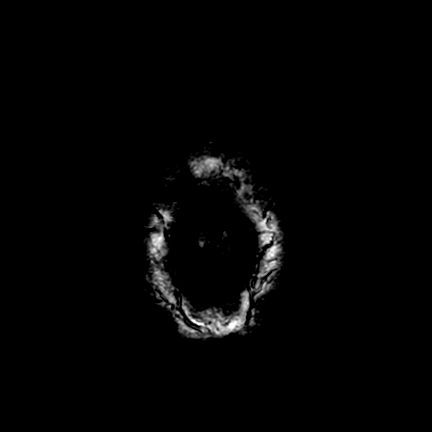

[Series 1002: T1 post-contrast · axial · 1.5mm · 0.64mm/px · z∈[-60,+97]mm · 6 of 106 slices shown (1 of 2)]
[im 1/106]
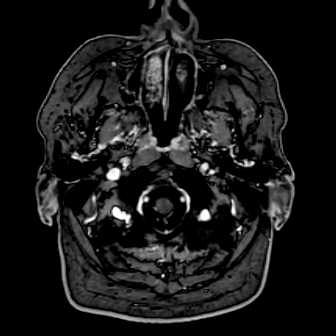
[im 22/106]
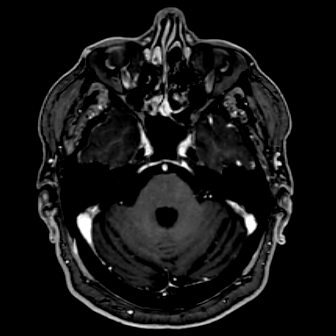
[im 43/106]
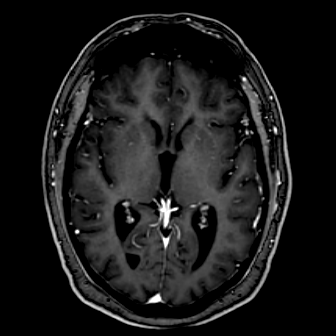
[im 64/106]
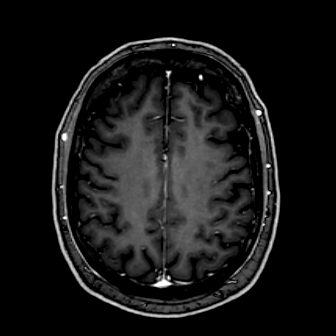
[im 85/106]
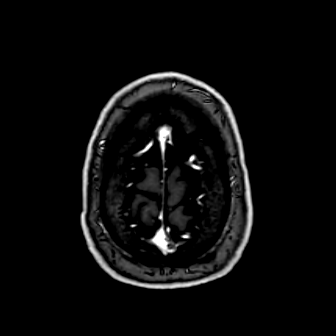
[im 106/106]
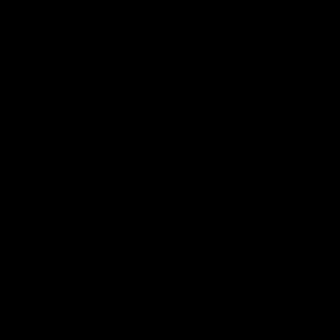

[Series 1003: T1 post-contrast · coronal · 1.5mm · 0.55mm/px · 4 of 75 slices shown (2 of 2)]
[im 1/75]
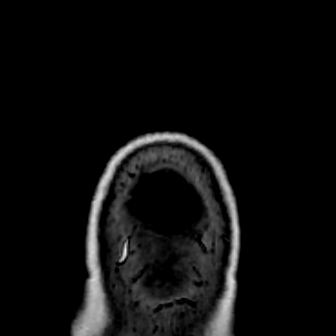
[im 25/75]
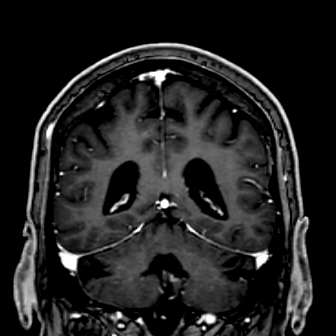
[im 50/75]
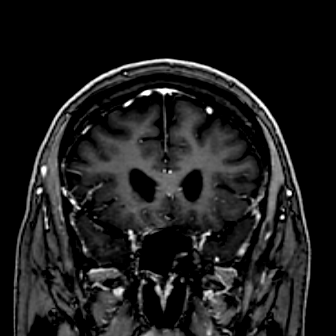
[im 75/75]
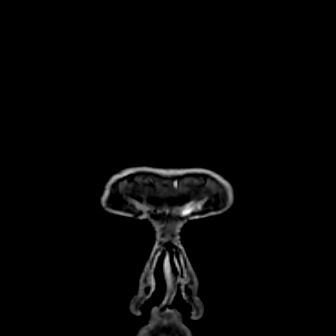

[Series 1101: T1 fat-sat post-contrast · axial · 5.0mm · 0.48mm/px · z∈[-79,+83]mm · 2 of 28 slices shown]
[im 1/28]
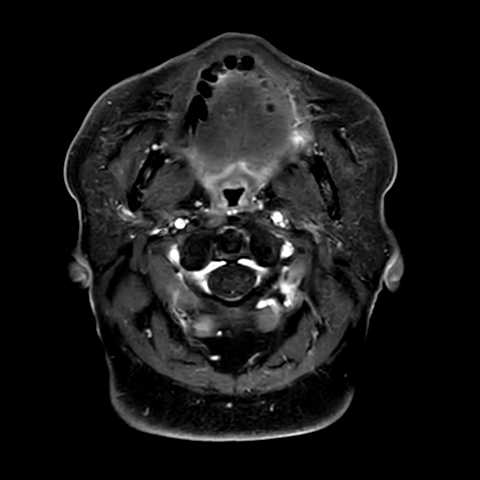
[im 28/28]
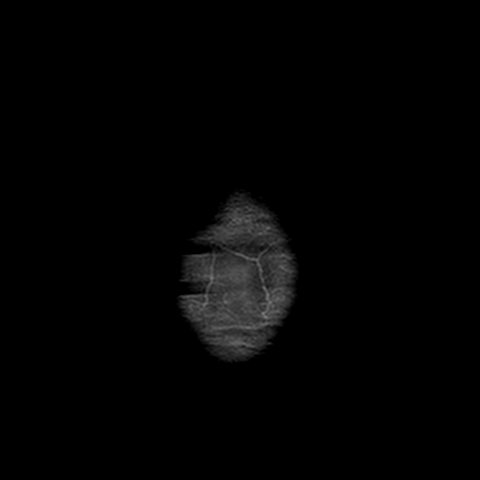

[31 of 48 positions shown; findings below may reference images not displayed]

FINDINGS: -------------------------------------------------------------------------------- 
------------------------- 
INTRACRANIAL: 
Mild nonspecific subcortical, deep white matter, and periventricular T-2 FLAIR 
hyperintensity is likely chronic microangiopathy. No mass. Right retrocerebellar 
arachnoid cyst measures 1.3 x 1.5 cm in axial dimension. No other extra-axial 
fluid collection. Mild generalized brain volume loss without lobar specific 
pattern. The mesial temporal lobes and hippocampal formations are preserved 
bilaterally. No heterotopia. 2 mm nonenhancing focus in the adenohypophysis may 
reflect a small pituitary microadenoma. 
No acute ischemia. No abnormal foci of susceptibility artifact in the brain. 
Patency of the intracranial vascular flow voids.  No acute intracranial 
hemorrhage, mass effect, midline shift. No large sellar mass. No hydrocephalus. 
Cerebral volume is age appropriate.  No pathologic contrast enhancement.  
-------------------------------------------------------------------------------- 
----------------------- 
OTHER: 
ORBITS/SINUSES/T-BONES:  Visualized orbits show no acute abnormality or mass.  
Mastoid air cells and middle ear cavities are grossly clear.  Ethmoid membrane 
thickening. Nasal septal deviation to the left. 
MARROW SIGNAL/SOFT TISSUES: No focal suspect signal abnormality. 5 mm Tornwaldt 
cyst. 
-------------------------------------------------------------------------------- 
-------------------
IMPRESSION: No acute intracranial process. No mass or mass effect. No abnormal intracranial 
enhancement. 
No focal brain volume loss with preserved mesial temporal lobes and hippocampal 
formations. No heterotopia. 
Mild chronic microangiopathy. 
2 mm nonenhancing focus in the adenohypophysis may reflect a small pituitary 
microadenoma. Correlate with appropriate laboratory markers.
